# Patient Record
Sex: Female | Born: 1990 | Hispanic: Yes | Marital: Single | Smoking: Never smoker
Health system: Southern US, Community
[De-identification: ages and names within clinical notes are randomized; demographics above are authoritative.]

## PROBLEM LIST (undated history)

## (undated) HISTORY — PX: DENTAL SURGERY: SHX609

---

## 2009-03-22 ENCOUNTER — Emergency Department: Payer: Self-pay | Admitting: Emergency Medicine

## 2009-05-15 ENCOUNTER — Encounter: Payer: Self-pay | Admitting: Obstetrics and Gynecology

## 2009-06-19 ENCOUNTER — Encounter: Payer: Self-pay | Admitting: Obstetrics & Gynecology

## 2009-11-19 ENCOUNTER — Inpatient Hospital Stay: Payer: Self-pay | Admitting: Obstetrics and Gynecology

## 2014-07-03 LAB — OB RESULTS CONSOLE GC/CHLAMYDIA: GC PROBE AMP, GENITAL: NEGATIVE

## 2015-09-14 LAB — OB RESULTS CONSOLE RUBELLA ANTIBODY, IGM: RUBELLA: IMMUNE

## 2015-09-14 LAB — OB RESULTS CONSOLE VARICELLA ZOSTER ANTIBODY, IGG: VARICELLA IGG: IMMUNE

## 2015-10-13 LAB — OB RESULTS CONSOLE ABO/RH: RH TYPE: POSITIVE

## 2015-12-15 LAB — OB RESULTS CONSOLE HIV ANTIBODY (ROUTINE TESTING): HIV: NONREACTIVE

## 2015-12-31 NOTE — L&D Delivery Note (Signed)
VAGINAL DELIVERY NOTE:  Date of Delivery: 03/17/2016 [facility]  Gestational Age/EDD: 224w1d 03/30/2016, by Last Menstrual Period Antepartum complications: none Attending Physician: Annamarie MajorPaul Colm Lyford, MD, FACOG Delivery Type: spontaneous vaginal delivery  Anesthesia: epidural Laceration: 1st degree Episiotomy: none Placenta: spontaneous Intrapartum complications: None Estimated Blood Loss: 300 mL GBS: Neg Procedure Details: Prolonged active phase, helped with Pitocin and Epidural.  Brief second stage.  First degree with right lateral extension albeit small.  Baby: Liveborn female, Apgars 9/9, weight 7 #, 11 oz

## 2016-02-26 LAB — OB RESULTS CONSOLE GC/CHLAMYDIA
CHLAMYDIA, DNA PROBE: NEGATIVE
GC PROBE AMP, GENITAL: NEGATIVE

## 2016-02-26 LAB — OB RESULTS CONSOLE GBS: GBS: NEGATIVE

## 2016-03-17 ENCOUNTER — Encounter: Payer: Self-pay | Admitting: *Deleted

## 2016-03-17 ENCOUNTER — Inpatient Hospital Stay
Admission: EM | Admit: 2016-03-17 | Discharge: 2016-03-19 | DRG: 775 | Disposition: A | Discharge status: Home / Self Care | Payer: Medicaid Other | Attending: Obstetrics & Gynecology | Admitting: Obstetrics & Gynecology

## 2016-03-17 ENCOUNTER — Inpatient Hospital Stay: Payer: Medicaid Other | Admitting: Anesthesiology

## 2016-03-17 DIAGNOSIS — Z9889 Other specified postprocedural states: Secondary | ICD-10-CM | POA: Diagnosis not present

## 2016-03-17 DIAGNOSIS — O4690 Antepartum hemorrhage, unspecified, unspecified trimester: Secondary | ICD-10-CM | POA: Diagnosis present

## 2016-03-17 DIAGNOSIS — Z3A38 38 weeks gestation of pregnancy: Secondary | ICD-10-CM

## 2016-03-17 DIAGNOSIS — Z79899 Other long term (current) drug therapy: Secondary | ICD-10-CM

## 2016-03-17 LAB — RAPID HIV SCREEN (HIV 1/2 AB+AG)
HIV 1/2 Antibodies: NONREACTIVE
HIV-1 P24 Antigen - HIV24: NONREACTIVE

## 2016-03-17 LAB — TYPE AND SCREEN
ABO/RH(D): O POS
ANTIBODY SCREEN: NEGATIVE

## 2016-03-17 LAB — CHLAMYDIA/NGC RT PCR (ARMC ONLY)
CHLAMYDIA TR: NOT DETECTED
N gonorrhoeae: NOT DETECTED

## 2016-03-17 LAB — CBC
HEMATOCRIT: 31.6 % — AB (ref 35.0–47.0)
HEMOGLOBIN: 10.8 g/dL — AB (ref 12.0–16.0)
MCH: 27.6 pg (ref 26.0–34.0)
MCHC: 34.2 g/dL (ref 32.0–36.0)
MCV: 80.8 fL (ref 80.0–100.0)
Platelets: 210 10*3/uL (ref 150–440)
RBC: 3.92 MIL/uL (ref 3.80–5.20)
RDW: 16 % — ABNORMAL HIGH (ref 11.5–14.5)
WBC: 7.4 10*3/uL (ref 3.6–11.0)

## 2016-03-17 LAB — ABO/RH: ABO/RH(D): O POS

## 2016-03-17 MED ORDER — FENTANYL 2.5 MCG/ML W/ROPIVACAINE 0.2% IN NS 100 ML EPIDURAL INFUSION (ARMC-ANES)
EPIDURAL | Status: AC
  Filled 2016-03-17: qty 100

## 2016-03-17 MED ORDER — OXYTOCIN 40 UNITS IN LACTATED RINGERS INFUSION - SIMPLE MED
1.0000 m[IU]/min | INTRAVENOUS | Status: DC
  Administered 2016-03-17: 1 m[IU]/min via INTRAVENOUS

## 2016-03-17 MED ORDER — METHYLERGONOVINE MALEATE 0.2 MG/ML IJ SOLN
0.2000 mg | Freq: Once | INTRAMUSCULAR | Status: AC
  Administered 2016-03-17: 0.2 mg via INTRAMUSCULAR

## 2016-03-17 MED ORDER — BUPIVACAINE HCL (PF) 0.25 % IJ SOLN
INTRAMUSCULAR | Status: DC | PRN
  Administered 2016-03-17: 9 mL via EPIDURAL

## 2016-03-17 MED ORDER — MISOPROSTOL 200 MCG PO TABS
ORAL_TABLET | ORAL | Status: AC
  Filled 2016-03-17: qty 4

## 2016-03-17 MED ORDER — LIDOCAINE-EPINEPHRINE (PF) 1.5 %-1:200000 IJ SOLN
INTRAMUSCULAR | Status: DC | PRN
  Administered 2016-03-17: 3 mL via PERINEURAL

## 2016-03-17 MED ORDER — ACETAMINOPHEN 325 MG PO TABS
650.0000 mg | ORAL_TABLET | ORAL | Status: DC | PRN
Start: 2016-03-17 — End: 2016-03-18

## 2016-03-17 MED ORDER — LACTATED RINGERS IV SOLN
INTRAVENOUS | Status: DC
Start: 2016-03-17 — End: 2016-03-18
  Administered 2016-03-17: 17:00:00 via INTRAVENOUS

## 2016-03-17 MED ORDER — EPHEDRINE 5 MG/ML INJ
10.0000 mg | INTRAVENOUS | Status: DC | PRN
  Filled 2016-03-17: qty 2

## 2016-03-17 MED ORDER — OXYTOCIN 10 UNIT/ML IJ SOLN
INTRAMUSCULAR | Status: AC
  Filled 2016-03-17: qty 2

## 2016-03-17 MED ORDER — TERBUTALINE SULFATE 1 MG/ML IJ SOLN: 0.2500 mg | Freq: Once | INTRAMUSCULAR | Status: DC | PRN

## 2016-03-17 MED ORDER — FENTANYL 2.5 MCG/ML W/ROPIVACAINE 0.2% IN NS 100 ML EPIDURAL INFUSION (ARMC-ANES): 9.0000 mL/h | EPIDURAL | Status: DC

## 2016-03-17 MED ORDER — OXYTOCIN BOLUS FROM INFUSION
500.0000 mL | INTRAVENOUS | Status: DC
  Administered 2016-03-17: 500 mL via INTRAVENOUS

## 2016-03-17 MED ORDER — LIDOCAINE HCL (PF) 1 % IJ SOLN
INTRAMUSCULAR | Status: AC
  Administered 2016-03-17: 5 mL
  Filled 2016-03-17: qty 30

## 2016-03-17 MED ORDER — OXYTOCIN 40 UNITS IN LACTATED RINGERS INFUSION - SIMPLE MED
2.5000 [IU]/h | INTRAVENOUS | Status: DC
  Filled 2016-03-17: qty 1000

## 2016-03-17 MED ORDER — ONDANSETRON HCL 4 MG/2ML IJ SOLN: 4.0000 mg | Freq: Four times a day (QID) | INTRAMUSCULAR | Status: DC | PRN

## 2016-03-17 MED ORDER — LACTATED RINGERS IV SOLN: 500.0000 mL | INTRAVENOUS | Status: DC | PRN

## 2016-03-17 MED ORDER — ACETAMINOPHEN 325 MG PO TABS
650.0000 mg | ORAL_TABLET | ORAL | Status: DC | PRN
Start: 2016-03-17 — End: 2016-03-18
  Administered 2016-03-17: 650 mg via ORAL
  Filled 2016-03-17: qty 2

## 2016-03-17 MED ORDER — PHENYLEPHRINE 40 MCG/ML (10ML) SYRINGE FOR IV PUSH (FOR BLOOD PRESSURE SUPPORT)
80.0000 ug | PREFILLED_SYRINGE | INTRAVENOUS | Status: DC | PRN
  Filled 2016-03-17: qty 2

## 2016-03-17 MED ORDER — AMMONIA AROMATIC IN INHA
RESPIRATORY_TRACT | Status: AC
Start: 2016-03-17 — End: 2016-03-18
  Filled 2016-03-17: qty 10

## 2016-03-17 MED ORDER — DIPHENHYDRAMINE HCL 50 MG/ML IJ SOLN: 12.5000 mg | INTRAMUSCULAR | Status: DC | PRN

## 2016-03-17 MED ORDER — BUTORPHANOL TARTRATE 1 MG/ML IJ SOLN: 1.0000 mg | INTRAMUSCULAR | Status: DC | PRN

## 2016-03-17 MED ORDER — METHYLERGONOVINE MALEATE 0.2 MG/ML IJ SOLN
INTRAMUSCULAR | Status: AC
Start: 2016-03-17 — End: 2016-03-18
  Filled 2016-03-17: qty 1

## 2016-03-17 MED ORDER — ONDANSETRON HCL 4 MG/2ML IJ SOLN
4.0000 mg | Freq: Four times a day (QID) | INTRAMUSCULAR | Status: DC | PRN
Start: 2016-03-17 — End: 2016-03-18

## 2016-03-17 MED ORDER — LACTATED RINGERS IV SOLN: 500.0000 mL | Freq: Once | INTRAVENOUS | Status: DC

## 2016-03-17 NOTE — H&P (Signed)
Obstetrics Admission History & Physical   Vaginal Bleeding   HPI:  25 y.o. G2P1001 @ 5547w1d (03/30/2016, by Last Menstrual Period). Admitted on 03/17/2016:   Patient Active Problem List   Diagnosis Date Noted  . Vaginal bleeding in pregnancy 03/17/2016  . Normal labor 03/17/2016     Presents for bloody show, also having mild cts.  Prenatal care at: at another place [facility]  No reported problems.  No PNL available, GBS is neg.  PMHx: History reviewed. No pertinent past medical history. PSHx:  Past Surgical History  Procedure Laterality Date  . Dental surgery Left    Medications:  Prescriptions prior to admission  Medication Sig Dispense Refill Last Dose  . Prenatal Vit-Fe Fumarate-FA (PRENATAL MULTIVITAMIN) TABS tablet Take 1 tablet by mouth daily at 12 noon.   Past Week at Unknown time   Allergies: has No Known Allergies. OBHx:  OB History  Gravida Para Term Preterm AB SAB TAB Ectopic Multiple Living  2 1 1  0 0 0 0 0 0 1    # Outcome Date GA Lbr Len/2nd Weight Sex Delivery Anes PTL Lv  2 Current           1 Term 11/20/15 5948w0d  4.224 kg (9 lb 5 oz) M Vag-Vacuum None N Y    Obstetric Comments  Pt had "large epis. Due to baby's size"   NWG:NFAOZHYQ/MVHQIONGEXBMFHx:Negative/unremarkable except as detailed in HPI. Soc Hx: Pregnancy welcomed  Objective:   Filed Vitals:   03/17/16 0631  BP: 110/65  Pulse: 68  Temp: 98.3 F (36.8 C)  Resp: 20   General: Well nourished, well developed female in no acute distress.  Skin: Warm and dry.  Cardiovascular:Regular rate and rhythm. Respiratory: Clear to auscultation bilateral. Normal respiratory effort Abdomen: NT gravid Neuro/Psych: Normal mood and affect.   Pelvic exam: is not limited by body habitus EGBUS: within normal limits Vagina: within normal limits and with normal mucosa blood in the vault Cervix: 3 cm Uterus: Spontaneous uterine activity  Adnexa: not evaluated  EFM:FHR: 130 bpm, variability: moderate,   accelerations:  Present,  decelerations:  Absent Toco: Frequency: Every 5-8 minutes   Perinatal info:  Blood type: unknown Rubella- Unknown Varicella -Unknown TDaP tetanus status unknown to the patient RPR NR / HIV Neg/ HBsAg Neg   Assessment & Plan:   25 y.o. G2P1001 @ 6947w1d, Admitted on 03/17/2016:  Labor.  Prior NSVD 6 years ago.       Admit for labor, Fetal Wellbeing Reassuring and AROM when Appropriate

## 2016-03-17 NOTE — Progress Notes (Signed)
Comfortable w epidural Cervix still 5 cm Ctxs q 3-4 min FHTs 140s, mod var, accels, rare var brief decel Will start Pitocin now Counseled on CS for arrest of dilation if no progress with Pitocin    Prior NSVD 6 years ago so still anticipate vag delivery FWR

## 2016-03-17 NOTE — Progress Notes (Signed)
  Labor Progress Note   25 y.o. G2P1001 @ 4118w1d , admitted for  Pregnancy, Labor Management.   Subjective:  Pain mild w ctxs.  Objective:  BP 107/68 mmHg  Pulse 73  Temp(Src) 98.3 F (36.8 C) (Oral)  Resp 16  Wt 58.968 kg (130 lb)  LMP 06/24/2015 (LMP Unknown)  Breastfeeding? Unknown Abd: moderate Extr: trace to 1+ bilateral pedal edema SVE: CERVIX: 4 cm dilated, 80 effaced, -2 station  EFM: FHR: 140 bpm, variability: moderate,  accelerations:  Present,  decelerations:  Absent Toco: Frequency: Every 5 minutes Labs: I have reviewed the patient's lab results.   Assessment & Plan:  G2P1001 @ 2018w1d, admitted for  Pregnancy and Labor/Delivery Management  1. Pain management: none. 2. FWB: FHT category 1.  3. ID: GBS negative 4. Labor management: Cont current plan of mgt- exp mgt. All discussed with patient, see orders

## 2016-03-17 NOTE — Discharge Summary (Signed)
  Obstetrical Discharge Summary  Date of Admission: 03/17/2016 Date of Discharge: 03/19/2016 Discharge Diagnosis: Term Pregnancy-delivered Primary OB:  [facility] --- [facility]   Gestational Age at Delivery: 325w1d  Antepartum complications: none Date of Delivery: 03/17/16  Delivered By: Tiburcio PeaHarris Delivery Type: spontaneous vaginal delivery Intrapartum complications/course: None Anesthesia: epidural Placenta: spontaneous Laceration: 1st degree Episiotomy: none Live born female  Birth Weight:  7-10/3479 APGAR: 9, 9   Post partum course: Since the delivery, patient has tolerate activity, diet, and daily functions without difficulty or complication.  Min lochia.  No breast concerns at this time.  No signs of depression currently.   Postpartum Exam:General appearance: alert, cooperative and appears stated age  Disposition: home with infant Rh Immune globulin given: not applicable Rubella vaccine given: not applicable Varicella vaccine given: not applicable Tdap vaccine given in AP or PP setting: given during prenatal care Flu vaccine given in AP or PP setting: given during prenatal care Contraception: Nexplanon  Prenatal Labs: O POS//Rubella Immune//RPR negative//HIV negative/HepB Surface Ag negative//plans to breastfeed  Plan:  Brandi Wallace was discharged to home in good condition. [facility]  Discharge Medications:   Medication List    TAKE these medications        prenatal multivitamin Tabs tablet  Take 1 tablet by mouth daily at 12 noon.        Follow-up arrangements:  Make appointment for 6 week follow up visit [facility]   Brandi Wallace, CNM   This patient and plan were discussed with Dr Bonney AidStaebler 03/19/2016

## 2016-03-17 NOTE — Anesthesia Procedure Notes (Signed)
Epidural Patient location during procedure: OB Start time: 03/17/2016 7:11 PM End time: 03/17/2016 7:19 PM  Staffing Anesthesiologist: Yves DillARROLL, Cici Rodriges Performed by: anesthesiologist   Preanesthetic Checklist Completed: patient identified, site marked, surgical consent, pre-op evaluation, timeout performed, IV checked, risks and benefits discussed and monitors and equipment checked  Epidural Patient position: sitting Prep: Betadine and site prepped and draped Patient monitoring: heart rate, cardiac monitor, continuous pulse ox and blood pressure Approach: midline Location: L3-L4 Injection technique: LOR air  Needle:  Needle type: Tuohy  Needle gauge: 18 G Needle length: 9 cm Test dose: negative and 1.5% lidocaine with Epi 1:200 K  Assessment Sensory level: T8  Additional Notes Time out called.  Patient placed in sitting position and prepped and draped in sterile fashion.  A skin wheal was made with 1% Lidocaine plain at the L3-L4 interspace.  An 8018 G Tuohy needle was advanced to the epidural space by a loss of resistance technique.  The epidural cateter was threaded 3 cm with negative TD, no heme and no paresthesia.   The patient tolerated the procedure well and the catheter was fixed in sterile fashion and tegaderm and tape applied.Reason for block:procedure for pain

## 2016-03-17 NOTE — Progress Notes (Signed)
  Labor Progress Note   25 y.o. G2P1001 @ 6934w1d , admitted for Pregnancy, Labor Management.   Subjective:  Pain mild w ctxs.  No desire for analgesia.  Objective:  BP 109/69 mmHg  Pulse 71  Temp(Src) 98.3 F (36.8 C) (Oral)  Resp 16  Wt 58.968 kg (130 lb)  Abd: moderate pain Extr: trace to 1+ bilateral pedal edema SVE: CERVIX: 5cm dilated, 90 effaced, -1station   EFM: FHR: 140 bpm, variability: moderate,  accelerations:  Present,  decelerations:  Absent Toco: Frequency: Every 3-4 minutes  Assessment & Plan:  G2P1001 @ 9434w1d, admitted for Pregnancy and Labor/Delivery Management  1. Pain management: none. 2. FWB: FHT category 1.  3. ID: GBS negative 4. Labor management: AROM clear. Plan Pitocin to help augment labor as needed (not started yet). . All discussed with patient, see orders

## 2016-03-17 NOTE — Progress Notes (Signed)
  Labor Progress Note   25 y.o. G2P1001 @ 178w1d , admitted for Pregnancy, Labor Management.   Subjective:  Pain mild w ctxs.  No desire for analgesia.  Objective:  BP 109/69 mmHg  Pulse 71  Temp(Src) 98.3 F (36.8 C) (Oral)  Resp 16  Wt 58.968 kg (130 lb)  LMP 06/24/2015 (LMP Unknown)  Breastfeeding? Unknown Abd: moderate Extr: trace to 1+ bilateral pedal edema SVE: CERVIX: 4 cm dilated, 80 effaced, -2 station - - - UNCHANGED  EFM: FHR: 140 bpm, variability: moderate,  accelerations:  Present,  decelerations:  Absent Toco: Frequency: Every 5 minutes  Assessment & Plan:  G2P1001 @ 1178w1d, admitted for Pregnancy and Labor/Delivery Management  1. Pain management: none. 2. FWB: FHT category 1.  3. ID: GBS negative 4. Labor management: Pitocin to help augment labor . All discussed with patient, see orders

## 2016-03-17 NOTE — Anesthesia Preprocedure Evaluation (Signed)
Anesthesia Evaluation  Patient identified by MRN, date of birth, ID band Patient awake    Reviewed: Allergy & Precautions, NPO status , Patient's Chart, lab work & pertinent test results  Airway Mallampati: II  TM Distance: >3 FB Neck ROM: Full    Dental no notable dental hx.    Pulmonary neg pulmonary ROS,    Pulmonary exam normal breath sounds clear to auscultation       Cardiovascular negative cardio ROS Normal cardiovascular exam     Neuro/Psych negative neurological ROS  negative psych ROS   GI/Hepatic negative GI ROS, Neg liver ROS,   Endo/Other  negative endocrine ROS  Renal/GU negative Renal ROS  negative genitourinary   Musculoskeletal negative musculoskeletal ROS (+)   Abdominal Normal abdominal exam  (+)   Peds negative pediatric ROS (+)  Hematology negative hematology ROS (+)   Anesthesia Other Findings   Reproductive/Obstetrics (+) Pregnancy                             Anesthesia Physical Anesthesia Plan  ASA: II  Anesthesia Plan: Epidural   Post-op Pain Management:    Induction:   Airway Management Planned: Natural Airway  Additional Equipment:   Intra-op Plan:   Post-operative Plan:   Informed Consent: I have reviewed the patients History and Physical, chart, labs and discussed the procedure including the risks, benefits and alternatives for the proposed anesthesia with the patient or authorized representative who has indicated his/her understanding and acceptance.   Dental advisory given  Plan Discussed with: CRNA and Surgeon  Anesthesia Plan Comments:         Anesthesia Quick Evaluation

## 2016-03-17 NOTE — OB Triage Note (Addendum)
Pt noting some bright red vaginal bleeding when arising to void this am, approx 0515. Reports nil pain before, during or after episode. Reports last v/e at office- last Monday, does not remember result of exam. Denies intercourse recently. States she did not need to wear a pad into hospital. Panties dry. Pt states she passed as small amount of mucousy discharge when voiding presently.

## 2016-03-17 NOTE — Progress Notes (Signed)
2055-Pt noting that it is somewhat hard to catch her breath. HR bsreline 60's-70's, O2 sat 100%. Epidural levels checked- pt numb bilaterally to 2" below breasts. Hob elevated. No c/o's of numbness with hands, arms. Feet moving- comfortable with u/c's- notes rectal pressure at peak of u/c's at times. Alert and orientated. Pt reassurred appropriately Dr Hal Hopecarrol paged, given report. Dr requesting continous epidural infusion be decreased to 7 and hob remain elevated. Done

## 2016-03-18 LAB — CBC
HEMATOCRIT: 31.1 % — AB (ref 35.0–47.0)
HEMOGLOBIN: 10.4 g/dL — AB (ref 12.0–16.0)
MCH: 27 pg (ref 26.0–34.0)
MCHC: 33.6 g/dL (ref 32.0–36.0)
MCV: 80.3 fL (ref 80.0–100.0)
Platelets: 181 10*3/uL (ref 150–440)
RBC: 3.87 MIL/uL (ref 3.80–5.20)
RDW: 15.7 % — AB (ref 11.5–14.5)
WBC: 11.4 10*3/uL — AB (ref 3.6–11.0)

## 2016-03-18 LAB — HEPATITIS B SURFACE ANTIGEN: Hepatitis B Surface Ag: NEGATIVE

## 2016-03-18 MED ORDER — ACETAMINOPHEN 325 MG PO TABS: 650.0000 mg | ORAL_TABLET | ORAL | Status: DC | PRN

## 2016-03-18 MED ORDER — SIMETHICONE 80 MG PO CHEW: 80.0000 mg | CHEWABLE_TABLET | ORAL | Status: DC | PRN

## 2016-03-18 MED ORDER — LANOLIN HYDROUS EX OINT: TOPICAL_OINTMENT | CUTANEOUS | Status: DC | PRN

## 2016-03-18 MED ORDER — WITCH HAZEL-GLYCERIN EX PADS: 1.0000 "application " | MEDICATED_PAD | CUTANEOUS | Status: DC | PRN

## 2016-03-18 MED ORDER — SENNOSIDES-DOCUSATE SODIUM 8.6-50 MG PO TABS
2.0000 | ORAL_TABLET | ORAL | Status: DC
  Administered 2016-03-19: 2 via ORAL
  Filled 2016-03-18: qty 2

## 2016-03-18 MED ORDER — IBUPROFEN 600 MG PO TABS
ORAL_TABLET | ORAL | Status: AC
  Administered 2016-03-18: 600 mg via ORAL
  Filled 2016-03-18: qty 1

## 2016-03-18 MED ORDER — SODIUM CHLORIDE 0.9% FLUSH: 3.0000 mL | INTRAVENOUS | Status: DC | PRN

## 2016-03-18 MED ORDER — ZOLPIDEM TARTRATE 5 MG PO TABS: 5.0000 mg | ORAL_TABLET | Freq: Every evening | ORAL | Status: DC | PRN

## 2016-03-18 MED ORDER — IBUPROFEN 600 MG PO TABS
600.0000 mg | ORAL_TABLET | Freq: Four times a day (QID) | ORAL | Status: DC
  Administered 2016-03-18: 600 mg via ORAL
  Filled 2016-03-18: qty 1

## 2016-03-18 MED ORDER — BENZOCAINE-MENTHOL 20-0.5 % EX AERO
1.0000 "application " | INHALATION_SPRAY | CUTANEOUS | Status: DC | PRN
  Filled 2016-03-18: qty 56

## 2016-03-18 MED ORDER — IBUPROFEN 600 MG PO TABS
600.0000 mg | ORAL_TABLET | Freq: Four times a day (QID) | ORAL | Status: DC
  Administered 2016-03-18: 600 mg via ORAL

## 2016-03-18 MED ORDER — DIPHENHYDRAMINE HCL 25 MG PO CAPS: 25.0000 mg | ORAL_CAPSULE | Freq: Four times a day (QID) | ORAL | Status: DC | PRN

## 2016-03-18 MED ORDER — SODIUM CHLORIDE 0.9 % IV SOLN: 250.0000 mL | INTRAVENOUS | Status: DC | PRN

## 2016-03-18 MED ORDER — SODIUM CHLORIDE 0.9% FLUSH
3.0000 mL | Freq: Two times a day (BID) | INTRAVENOUS | Status: DC
Start: 2016-03-18 — End: 2016-03-18

## 2016-03-18 MED ORDER — ONDANSETRON HCL 4 MG/2ML IJ SOLN: 4.0000 mg | INTRAMUSCULAR | Status: DC | PRN

## 2016-03-18 MED ORDER — DIBUCAINE 1 % RE OINT: 1.0000 "application " | TOPICAL_OINTMENT | RECTAL | Status: DC | PRN

## 2016-03-18 MED ORDER — ONDANSETRON HCL 4 MG PO TABS: 4.0000 mg | ORAL_TABLET | ORAL | Status: DC | PRN

## 2016-03-18 MED ORDER — IBUPROFEN 600 MG PO TABS
600.0000 mg | ORAL_TABLET | Freq: Four times a day (QID) | ORAL | Status: DC
  Administered 2016-03-18 – 2016-03-19 (×4): 600 mg via ORAL
  Filled 2016-03-18 (×4): qty 1

## 2016-03-18 NOTE — Lactation Note (Signed)
This note was copied from a baby's chart. Lactation Consultation Note  Patient Name: Boy Ardis RowanKarina Gonzalez Diaz ZOXWR'UToday's Date: 03/18/2016 Reason for consult: Follow-up assessment   Maternal Data Does the patient have breastfeeding experience prior to this delivery?: Yes Mom encouraged to  breastfeed first before offering formula, call for help if baby not latching well before offering formula. breastfed last child for 2 yrs.   Feeding Feeding Type: Breast Milk Nipple Type: Slow - flow Length of feed: 40 min (20 min. each breast)  LATCH Score/Interventions Latch: Grasps breast easily, tongue down, lips flanged, rhythmical sucking.  Audible Swallowing: A few with stimulation Intervention(s): Skin to skin  Type of Nipple: Everted at rest and after stimulation  Comfort (Breast/Nipple): Soft / non-tender     Hold (Positioning): No assistance needed to correctly position infant at breast.  LATCH Score: 9  Lactation Tools Discussed/Used Tools: Bottle WIC Program: Yes   Consult Status Consult Status: PRN Date: 03/18/16    Dyann KiefMarsha D Chrisy Hillebrand 03/18/2016, 3:01 PM

## 2016-03-18 NOTE — Discharge Instructions (Signed)
Vaginal Delivery, Care After Refer to this sheet in the next few weeks. These discharge instructions provide you with information on caring for yourself after delivery. Your health care provider may also give you specific instructions. Your treatment has been planned according to the most current medical practices available, but problems sometimes occur. Call your health care provider if you have any problems or questions after you go home. HOME CARE INSTRUCTIONS  Take over-the-counter or prescription medicines only as directed by your health care provider or pharmacist.  Do not drink alcohol, especially if you are breastfeeding or taking medicine to relieve pain.  Do not chew or smoke tobacco.  Do not use illegal drugs.  Continue to use good perineal care. Good perineal care includes:  Wiping your perineum from front to back.  Keeping your perineum clean.  Do not use tampons or douche for the next 6 weeks.   Showers only, no tub baths for the next 6 weeks. Use a sitz bath as needed.   Wear a well-fitting bra that provides breast support.  Eat healthy foods.  Drink enough fluids to keep your urine clear or pale yellow.  Eat high-fiber foods such as whole grain cereals and breads, brown rice, beans, and fresh fruits and vegetables every day. These foods may help prevent or relieve constipation.  Follow your health care provider's recommendations regarding resumption of activities such as climbing stairs, driving, lifting, exercising, or traveling.  No sexual intercourse for at least the next 6 weeks, and then not until you feel ready.   Try to have someone help you with your household activities and your newborn for at least a few days after you leave the hospital.  Rest as much as possible. Try to rest or take a nap when your newborn is sleeping.  Increase your activities gradually.  Keep all of your scheduled postpartum appointments. It is very important to keep your  scheduled follow-up appointments. At these appointments, your health care provider will be checking to make sure that you are healing physically and emotionally. SEEK MEDICAL CARE IF:   You are passing large clots from your vagina. Save any clots to show your health care provider.  You have a foul smelling discharge from your vagina.  You have trouble urinating.  You are urinating frequently.  You have pain when you urinate.  You have a change in your bowel movements.  You have increasing redness, pain, or swelling near your vaginal incision (episiotomy) or vaginal tear.  You have pus draining from your episiotomy or vaginal tear.  Your episiotomy or vaginal tear is separating.  You have painful, hard, or reddened breasts.  You have a severe headache.  You have blurred vision or see spots.  You feel sad or depressed.  You have thoughts of hurting yourself or your newborn.  You have questions about your care, the care of your newborn, or medicines.  You are dizzy or light-headed.  You have a rash.  You have nausea or vomiting.  You were breastfeeding and have not had a menstrual period within 12 weeks after you stopped breastfeeding.  You are not breastfeeding and have not had a menstrual period by the 12th week after delivery.  You have a fever. SEEK IMMEDIATE MEDICAL CARE IF:   You have persistent pain.  You have chest pain.  You have shortness of breath.  You faint.  You have leg pain.  You have stomach pain.  Your vaginal bleeding saturates two or more sanitary  pads in 1 hour.   This information is not intended to replace advice given to you by your health care provider. Make sure you discuss any questions you have with your health care provider.   Document Released: 12/13/2000 Document Revised: 09/06/2015 Document Reviewed: 08/12/2012 Elsevier Interactive Patient Education [employer].

## 2016-03-18 NOTE — Anesthesia Postprocedure Evaluation (Signed)
Anesthesia Post Note  Patient: Brandi Wallace  Procedure(s) Performed: * No procedures listed *  Patient location during evaluation: Mother Baby Anesthesia Type: Epidural Level of consciousness: awake and alert and oriented Pain management: pain level controlled Vital Signs Assessment: post-procedure vital signs reviewed and stable Respiratory status: spontaneous breathing Cardiovascular status: stable Postop Assessment: no headache Anesthetic complications: no    Last Vitals:  Filed Vitals:   03/18/16 0245 03/18/16 0317  BP: 116/86 103/58  Pulse: 80 71  Temp:  36.9 C  Resp:  20    Last Pain:  Filed Vitals:   03/18/16 0441  PainSc: 0-No pain                 Abreanna Drawdy,  Alessandra BevelsJennifer M

## 2016-03-18 NOTE — Progress Notes (Signed)
Post Partum Day 1 Subjective: Sore perineum. Tolerating regular diet. Voiding without difficulty. cramping controlled by analgesics. Breast and Bt feeding  Objective: Blood pressure 99/58, pulse 68, temperature 98.6 F (37 C), temperature source Oral, resp. rate 20, height 5' (1.524 m), weight 58.968 kg (130 lb), last menstrual period 06/24/2015, SpO2 100 %, unknown if currently breastfeeding.  Physical Exam:  General: alert and happy, in NAD Lochia: appropriate Uterine Fundus: firm at U/ sl right of ML/ NT perineum: intact, no inflammation  DVT Evaluation: No evidence of DVT seen on physical exam.   Recent Labs  03/17/16 1015 03/18/16 0609  HGB 10.8* 10.4*  HCT 31.6* 31.1*  WBC 7.4 11.4*  PLT 210 181    Assessment/Plan: Stable PPD #1 Plan discharge for tomorrow Dermoplast and sitz baths Mild anemia-vitamins Breast and Bt TDAP and flu UTD O POS/ RI/ VI   LOS: 1 day   Dru Primeau 03/18/2016, 3:39 PM

## 2016-03-19 NOTE — Progress Notes (Signed)
Discharge instructions complete. Patient verbalizes understanding of teaching. Patient discharge home at 1145.

## 2016-03-20 LAB — VARICELLA-ZOSTER BY PCR: VARICELLA-ZOSTER, PCR: NEGATIVE

## 2016-03-20 LAB — RUBELLA SCREEN: Rubella: 4.39 index (ref >0.99)

## 2016-03-20 LAB — RPR: RPR: NONREACTIVE

## 2017-01-07 LAB — OB RESULTS CONSOLE GC/CHLAMYDIA
CHLAMYDIA, DNA PROBE: NEGATIVE
GC PROBE AMP, GENITAL: NEGATIVE

## 2017-01-07 LAB — OB RESULTS CONSOLE GBS: GBS: NEGATIVE

## 2017-07-01 LAB — OB RESULTS CONSOLE HIV ANTIBODY (ROUTINE TESTING): HIV: NONREACTIVE

## 2017-07-02 LAB — OB RESULTS CONSOLE VARICELLA ZOSTER ANTIBODY, IGG: Varicella: IMMUNE

## 2017-07-02 LAB — OB RESULTS CONSOLE RPR: RPR: NONREACTIVE

## 2017-07-02 LAB — OB RESULTS CONSOLE HEPATITIS B SURFACE ANTIGEN: Hepatitis B Surface Ag: NEGATIVE

## 2017-07-02 LAB — OB RESULTS CONSOLE RUBELLA ANTIBODY, IGM: RUBELLA: IMMUNE

## 2017-07-03 LAB — OB RESULTS CONSOLE GC/CHLAMYDIA: CHLAMYDIA, DNA PROBE: NEGATIVE

## 2017-07-29 ENCOUNTER — Other Ambulatory Visit: Payer: Self-pay | Admitting: Advanced Practice Midwife

## 2017-07-29 DIAGNOSIS — Z3482 Encounter for supervision of other normal pregnancy, second trimester: Secondary | ICD-10-CM

## 2017-08-01 ENCOUNTER — Ambulatory Visit
Admission: RE | Admit: 2017-08-01 | Discharge: 2017-08-01 | Disposition: A | Discharge status: Home / Self Care | Payer: Medicaid Other | Source: Ambulatory Visit | Attending: Advanced Practice Midwife | Admitting: Advanced Practice Midwife

## 2017-08-01 DIAGNOSIS — O4402 Placenta previa specified as without hemorrhage, second trimester: Secondary | ICD-10-CM | POA: Diagnosis not present

## 2017-08-01 DIAGNOSIS — Z3A14 14 weeks gestation of pregnancy: Secondary | ICD-10-CM | POA: Insufficient documentation

## 2017-08-01 DIAGNOSIS — Z3482 Encounter for supervision of other normal pregnancy, second trimester: Secondary | ICD-10-CM | POA: Diagnosis present

## 2017-08-20 IMAGING — US US OB COMP +14 WK
1 series · 3 of 3 positions shown · non-contrast
Comparison: none

CLINICAL DATA: Second trimester evaluation

EXAM:
OBSTETRICAL ULTRASOUND >14 WKS

[Series 1: us ob comp +14 wk · 3 acquisitions, 3 frames shown]
[im 1/3]
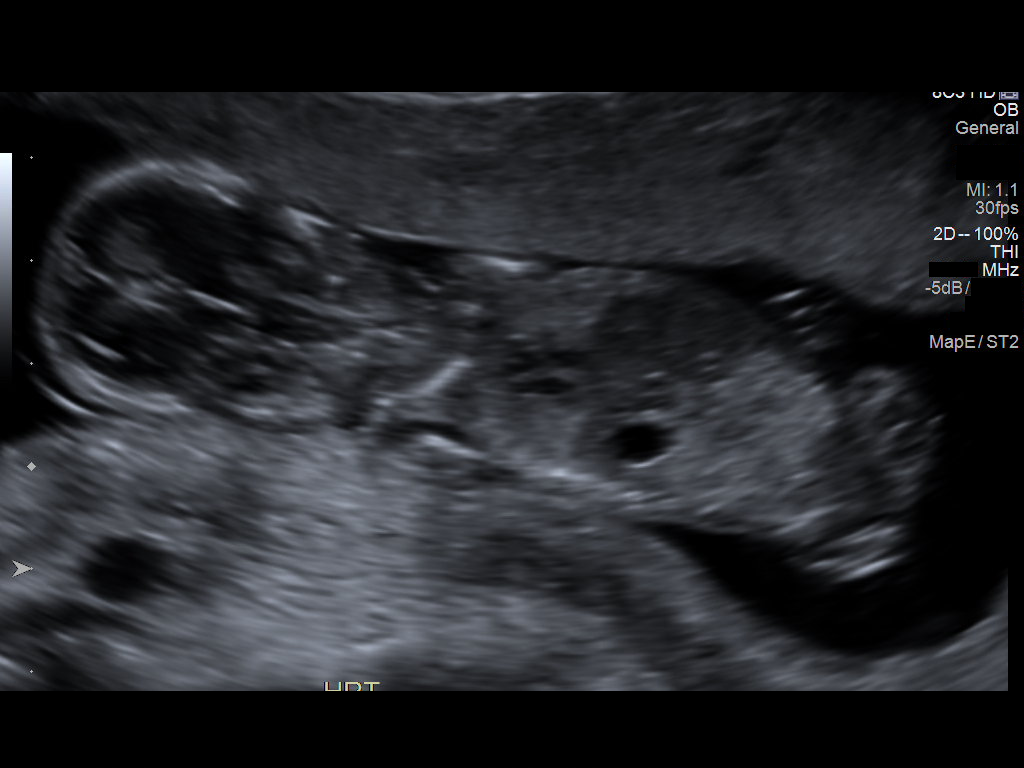
[im 2/3]
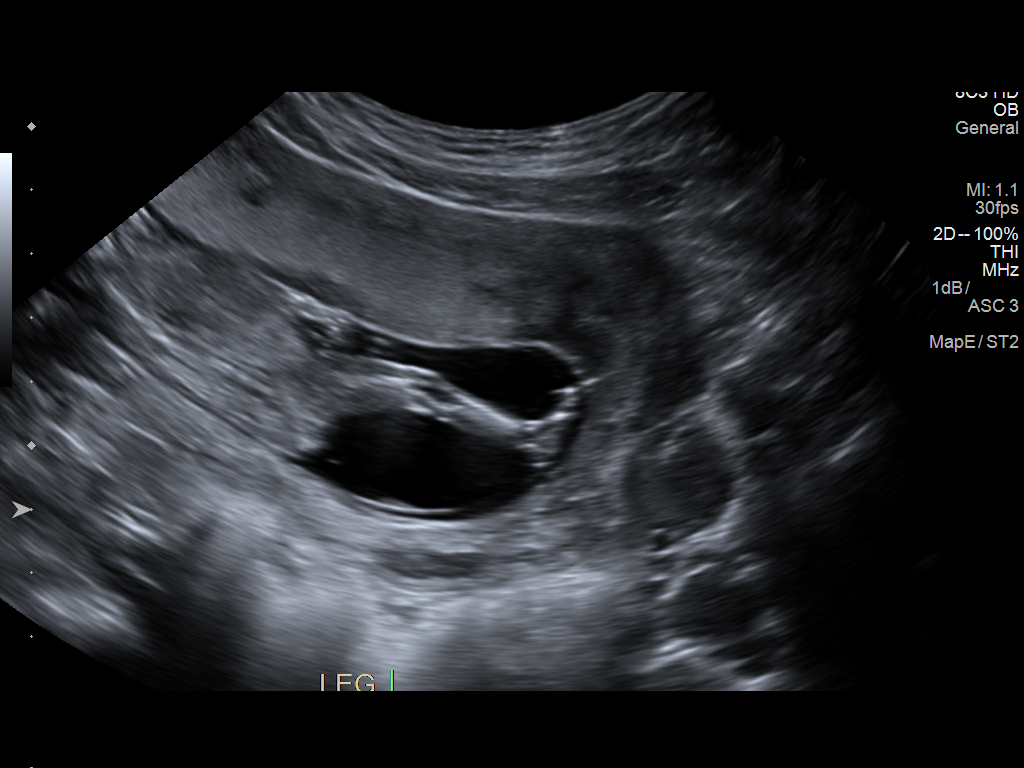
[im 3/3]
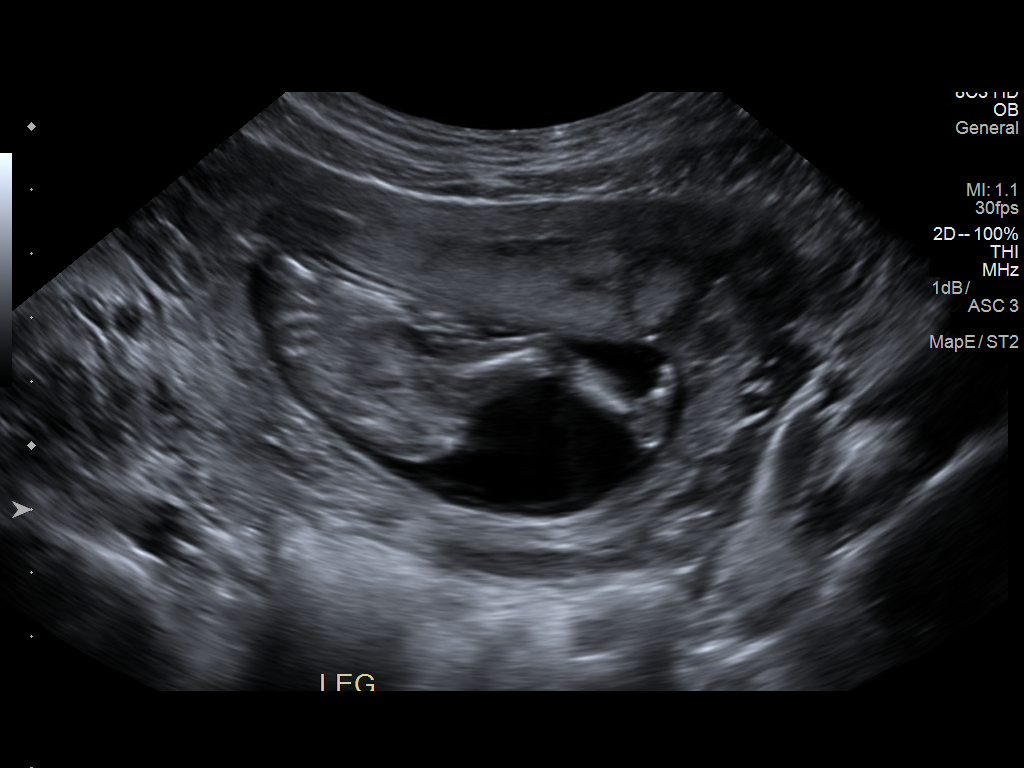

[3 of 3 positions shown; findings below may reference images not displayed]

FINDINGS: Number of Fetuses: 1

Heart Rate:  163 bpm

Movement: Present

Presentation: Variable

Previa: Complete placenta previa.

Placental Location: Anterior again with placenta previa

Amniotic Fluid (Subjective): Normal

Amniotic Fluid (Objective):

Vertical pocket 4cm

FETAL BIOMETRY

BPD:  2.4cm 14w 0d

HC:    9.4cm  14w   2d

AC:   7.9cm  14w   2d

FL:   1.3cm  14w   0d

Current Mean GA: 14w 1d              US EDC: 01/29/2017

Lateral Ventricles: Not visualized

Thalami/CSP: Not visualized

Posterior Fossa:  Not visual

Nuchal Region: Not visual

Upper Lip: Not visualized

Spine: Not visualized

4 Chamber Heart on Left: Not visualized

LVOT: Not visualized

RVOT: Not visualized

Stomach on Left: Visualized

3 Vessel Cord: Visualized

Cord Insertion site: Visualized

Kidneys: Not visualized

Bladder: Visualized

Extremities: Visualized

Technically difficult due to: Very limited exam due to 29
gestational age.

Maternal Findings:

Cervix:  4.7 cm and closed
IMPRESSION: 1.  Single viable intrauterine pregnancy at 14 weeks 1 day.

2.  Limited fetal evaluation due to early gestational age.

3. Complete placenta previa.  Follow-up exam suggested.

## 2017-09-02 ENCOUNTER — Other Ambulatory Visit (HOSPITAL_COMMUNITY): Payer: Self-pay | Admitting: Family Medicine

## 2017-09-02 DIAGNOSIS — Z3482 Encounter for supervision of other normal pregnancy, second trimester: Secondary | ICD-10-CM

## 2017-09-18 ENCOUNTER — Ambulatory Visit
Admission: RE | Admit: 2017-09-18 | Discharge: 2017-09-18 | Disposition: A | Discharge status: Home / Self Care | Payer: Self-pay | Source: Ambulatory Visit | Attending: Family Medicine | Admitting: Family Medicine

## 2017-09-18 DIAGNOSIS — Z3482 Encounter for supervision of other normal pregnancy, second trimester: Secondary | ICD-10-CM | POA: Insufficient documentation

## 2017-10-07 IMAGING — US US OB FOLLOW-UP
1 series · 14 of 28 positions shown · non-contrast
Comparison: none

CLINICAL DATA: Second trimester evaluation. Evaluate for placenta
previa.

EXAM:
OBSTETRICAL ULTRASOUND >14 WKS AND TRANSVAGINAL OB US

[Series 1: us ob follow-up · 0.25mm/px · 14 of 71 slices shown]
[im 3/71]
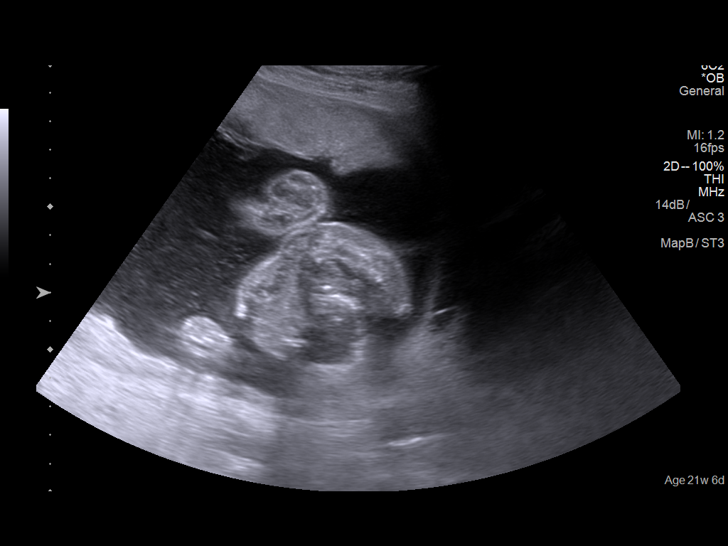
[im 8/71]
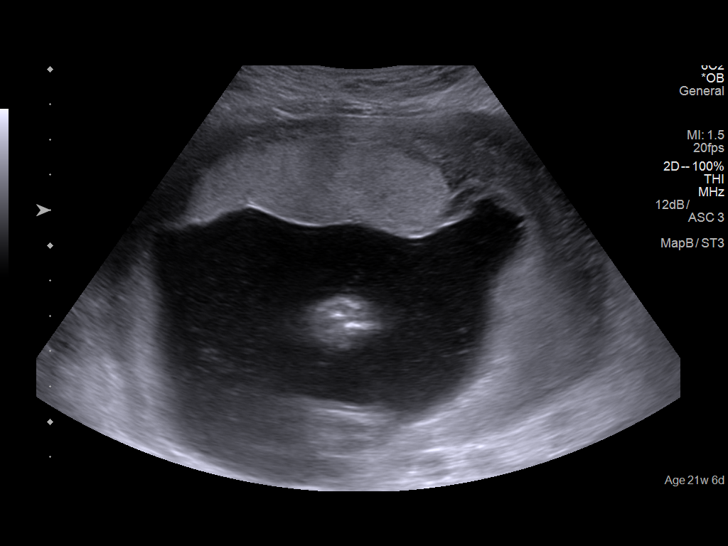
[im 13/71]
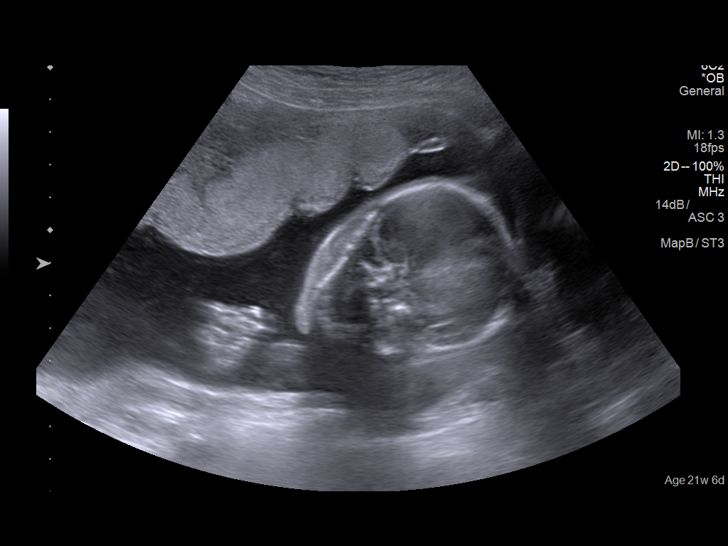
[im 19/71]
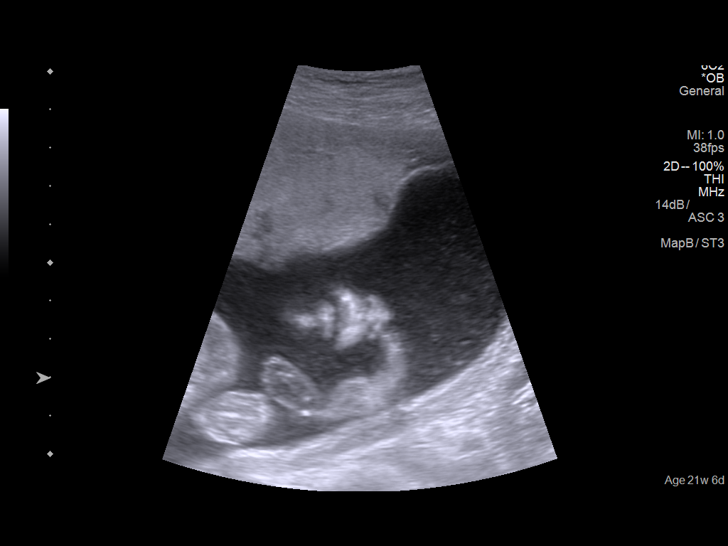
[im 24/71]
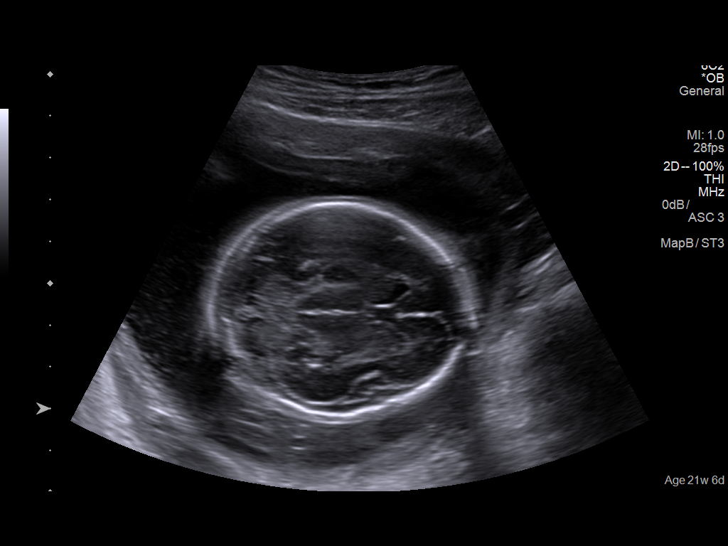
[im 29/71]
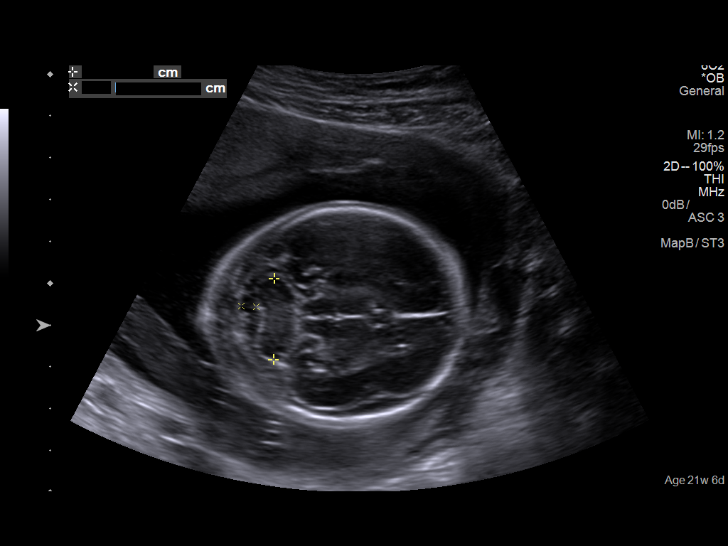
[im 34/71]
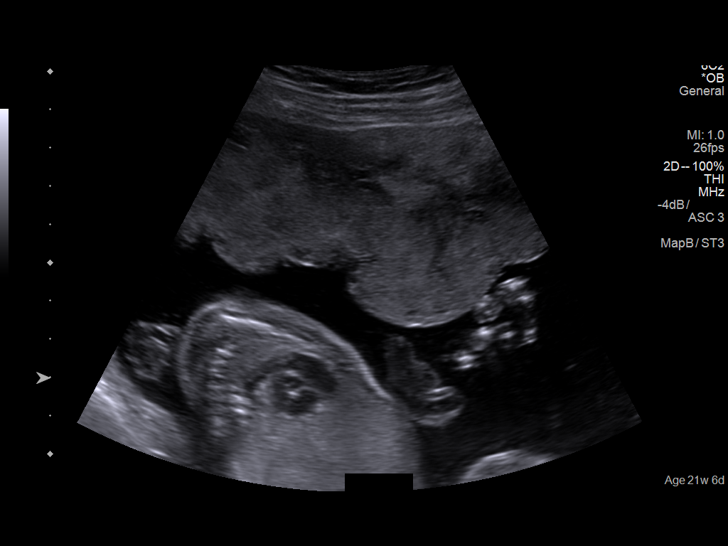
[im 39/71]
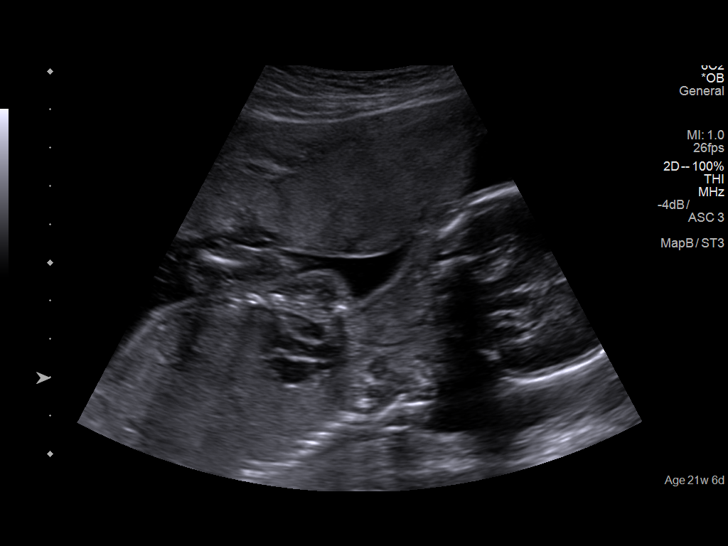
[im 45/71]
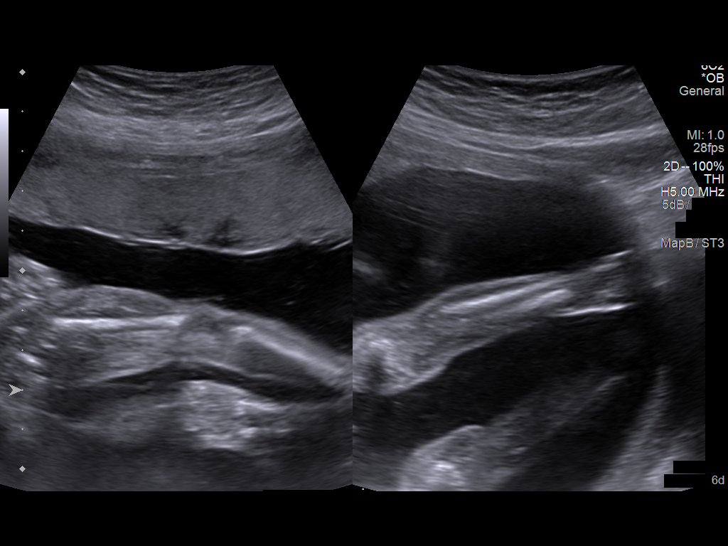
[im 50/71]
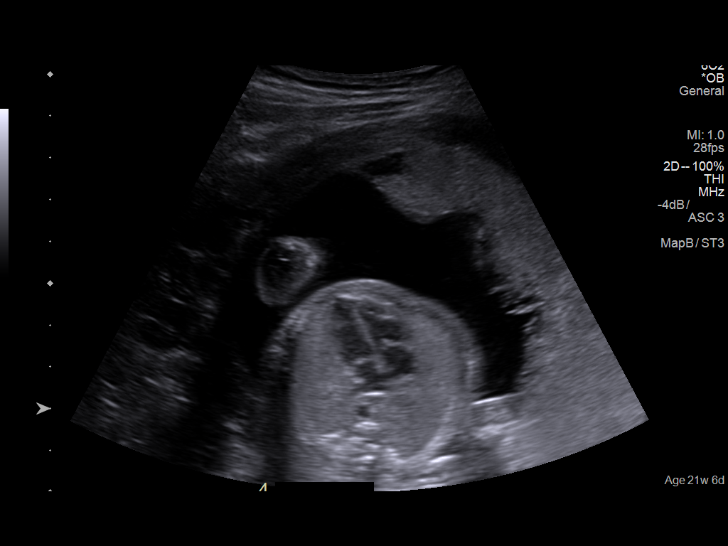
[im 55/71]
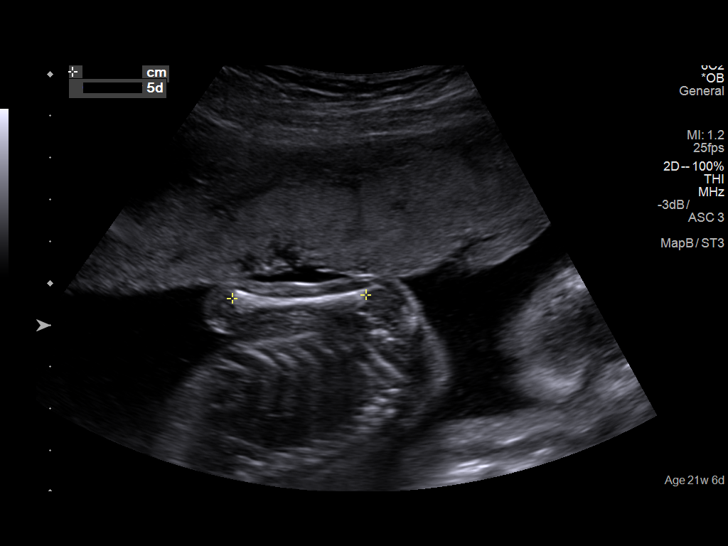
[im 60/71]
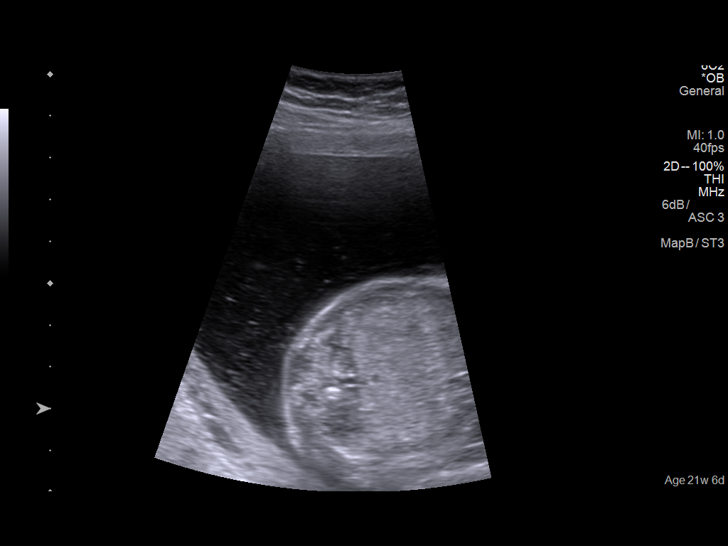
[im 65/71]
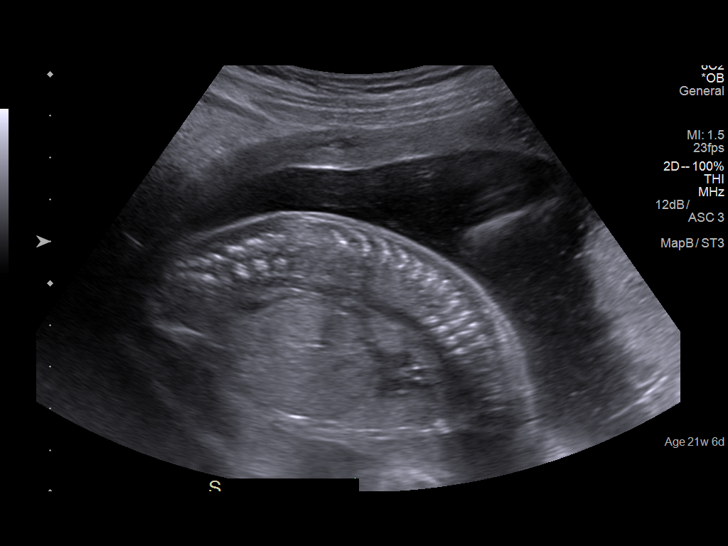
[im 71/71]
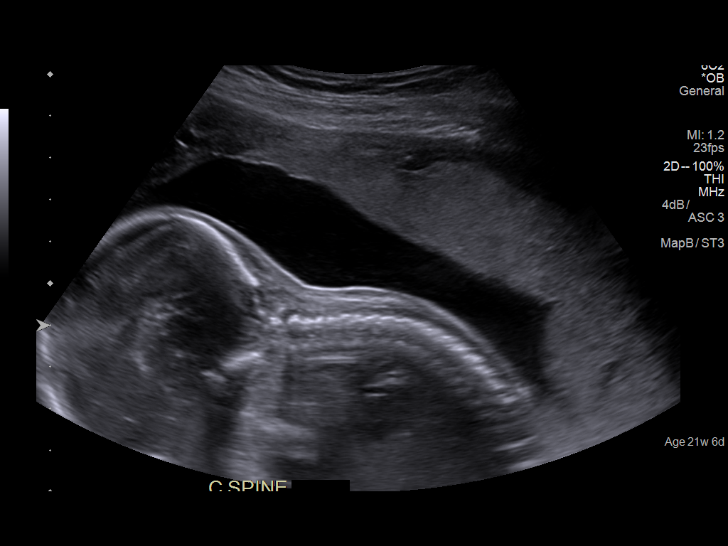

[14 of 28 positions shown; findings below may reference images not displayed]

FINDINGS: Number of Fetuses: 1

Heart Rate:  137 bpm

Movement: Present

Presentation: Cephalic

Previa: None visualized.  Interval resolution.

Placental Location: Anterior

Amniotic Fluid (Subjective): Normal

Amniotic Fluid (Objective):

Vertical pocket 7.1cm

FETAL BIOMETRY

BPD:  5.0cm 21w d

HC:    18.2 cmcm  20w   4d

AC:   16.4 cmcm  21w   3d

FL:   3.2 cmcm  20w   3d

Current Mean GA: 20w 6d              US EDC: 01/30/2018

FETAL ANATOMY

Lateral Ventricles: Visualized

Thalami/CSP: Visualized

Posterior Fossa:  Visualized

Upper Lip: Visualized

Spine: Visualized

4 Chamber Heart on Left: Visualized

LVOT: Visualized

RVOT: Visualized

Stomach on Left: Visualized

3 Vessel Cord: Visualized

Cord Insertion site: Visualized

Kidneys: Visualized

Bladder: Visualized

Extremities: Visualized

Maternal Findings:

Cervix:  Closed.
IMPRESSION: 1. Single viable intrauterine pregnancy in cephalic presentation at
20 weeks 6 days.

2. Interval resolution of placenta previa.

## 2017-11-11 LAB — OB RESULTS CONSOLE HIV ANTIBODY (ROUTINE TESTING): HIV: NONREACTIVE

## 2017-11-12 LAB — OB RESULTS CONSOLE RPR: RPR: NONREACTIVE

## 2017-12-30 NOTE — L&D Delivery Note (Signed)
Delivery Note Primary OB: [facility] Delivery Provider: Marcelyn BruinsJacelyn Schmid, CNM Gestational Age: Full term Antepartum complications: anemia, placenta previa (resolved) Intrapartum complications: Shoulder Dystocia  A viable Female was delivered via vertex presentation. No nuchal cord was present. Attempting gentle downward traction did not deliver the anterior shoulder. A shoulder dystocia was announced. Suprapubic pressure was attempted, followed by McRoberts maneuver.  The baby rotated to OP. Suprapubic pressure was again given, and delivery of the shoulder followed with gentle downward traction. The total length of the shoulder dystocia was one minute. The delivery of the body followed without difficulty. The infant was placed on the maternal abdomen. The umbilical cord was doubly clamped and cut following delayed cord clamping. The placenta was delivered spontaneously and was inspected and found to be intact with a three vessel cord. The cervix and vagina were inspected. There were no lacerations.  The fundus was firm after administration of Pitocin bolus. Patient and infant were bonding in stable condition. All counts were correct.  Apgars:7, 9  Weight:  8 lb 0 oz.   Placenta status: spontaneous and Intact.  Cord: 3 vessels; with the following complications: none.  Anesthesia:  epidural Episiotomy:  none Lacerations:  none Suture Repair: none Est. Blood Loss (mL):  750  Mom to postpartum.  Baby to Couplet care / Skin to Skin.  Marcelyn BruinsJacelyn Schmid, CNM [facility] 01/25/2018  5:47 AM   I was present during the entire delivery of the infant and placenta.  Shoulder dystocia managed by the CNM, as above, without difficulty.  Inspection of the infant after delivery showed no obvious limitations of motion of either arm (left was anterior shoulder). No lacerations and normal lochia at the end of the delivery of the placenta.    Thomasene MohairStephen Daija Routson, MD 01/25/2018 2:55 PM

## 2018-01-07 LAB — OB RESULTS CONSOLE GC/CHLAMYDIA
CHLAMYDIA, DNA PROBE: NEGATIVE
Gonorrhea: NEGATIVE

## 2018-01-07 LAB — OB RESULTS CONSOLE GBS: STREP GROUP B AG: NEGATIVE

## 2018-01-14 ENCOUNTER — Emergency Department
Admission: EM | Admit: 2018-01-14 | Discharge: 2018-01-14 | Disposition: A | Discharge status: Home / Self Care | Payer: Self-pay | Attending: Emergency Medicine | Admitting: Emergency Medicine

## 2018-01-14 ENCOUNTER — Encounter: Payer: Self-pay | Admitting: *Deleted

## 2018-01-14 ENCOUNTER — Other Ambulatory Visit: Payer: Self-pay

## 2018-01-14 DIAGNOSIS — M791 Myalgia, unspecified site: Secondary | ICD-10-CM | POA: Insufficient documentation

## 2018-01-14 DIAGNOSIS — R05 Cough: Secondary | ICD-10-CM | POA: Insufficient documentation

## 2018-01-14 DIAGNOSIS — R509 Fever, unspecified: Secondary | ICD-10-CM | POA: Insufficient documentation

## 2018-01-14 DIAGNOSIS — Z79899 Other long term (current) drug therapy: Secondary | ICD-10-CM | POA: Insufficient documentation

## 2018-01-14 DIAGNOSIS — Z3A38 38 weeks gestation of pregnancy: Secondary | ICD-10-CM | POA: Insufficient documentation

## 2018-01-14 DIAGNOSIS — J111 Influenza due to unidentified influenza virus with other respiratory manifestations: Secondary | ICD-10-CM | POA: Insufficient documentation

## 2018-01-14 DIAGNOSIS — R51 Headache: Secondary | ICD-10-CM | POA: Insufficient documentation

## 2018-01-14 DIAGNOSIS — R0989 Other specified symptoms and signs involving the circulatory and respiratory systems: Secondary | ICD-10-CM | POA: Insufficient documentation

## 2018-01-14 DIAGNOSIS — O9989 Other specified diseases and conditions complicating pregnancy, childbirth and the puerperium: Secondary | ICD-10-CM | POA: Insufficient documentation

## 2018-01-14 LAB — INFLUENZA PANEL BY PCR (TYPE A & B)
INFLAPCR: POSITIVE — AB
Influenza B By PCR: NEGATIVE

## 2018-01-14 MED ORDER — OSELTAMIVIR PHOSPHATE 75 MG PO CAPS
75.0000 mg | ORAL_CAPSULE | Freq: Once | ORAL | Status: AC
  Administered 2018-01-14: 75 mg via ORAL
  Filled 2018-01-14: qty 1

## 2018-01-14 MED ORDER — ACETAMINOPHEN 325 MG PO TABS
650.0000 mg | ORAL_TABLET | Freq: Once | ORAL | Status: AC
  Administered 2018-01-14: 650 mg via ORAL
  Filled 2018-01-14: qty 2

## 2018-01-14 MED ORDER — OSELTAMIVIR PHOSPHATE 75 MG PO CAPS: 75.0000 mg | ORAL_CAPSULE | Freq: Two times a day (BID) | ORAL | 0 refills | Status: AC

## 2018-01-14 NOTE — ED Triage Notes (Signed)
Pt is [redacted] weeks pregnant.  Pt has a cough and fever.sx for 2 days. Pt denies abd pain.  No vag bleeding.

## 2018-01-14 NOTE — ED Provider Notes (Signed)
[facility] Emergency Department Provider Note  ____________________________________________   First MD Initiated Contact with Patient 01/14/18 2021     (approximate)  I have reviewed the triage vital signs and the nursing notes.   HISTORY  Chief Complaint Cough and Fever   HPI Brandi Wallace is a 27 y.o. female who is [redacted] weeks pregnant who is presenting to the emergency department today with 2 days of subjective fever, body aches, runny nose and a dry cough.  She also says that she is a frontal headache with a dry cough.  Is denying any chest pain, abdominal pain, vaginal bleeding or discharge.  Husband says that he was exposed to someone with flu but the patient has not been directly exposed to anyone with flu.  However, her to children do have active URIs with runny nose and cough.   No past medical history on file.  Patient Active Problem List   Diagnosis Date Noted  . Postpartum care following vaginal delivery 03/19/2016  . Vaginal bleeding in pregnancy 03/17/2016  . Normal labor 03/17/2016    Past Surgical History:  Procedure Laterality Date  . DENTAL SURGERY Left     Prior to Admission medications   Medication Sig Start Date End Date Taking? Authorizing Provider  Prenatal Vit-Fe Fumarate-FA (PRENATAL MULTIVITAMIN) TABS tablet Take 1 tablet by mouth daily at 12 noon.    [provider]    Allergies Patient has no known allergies.  No family history on file.  Social History Social History   Tobacco Use  . Smoking status: Never Smoker  . Smokeless tobacco: Never Used  Substance Use Topics  . Alcohol use: No  . Drug use: No    Review of Systems  Constitutional: No fever/chills Eyes: No visual changes. ENT: No sore throat. Cardiovascular: Denies chest pain. Respiratory: As above Gastrointestinal: No abdominal pain.  No nausea, no vomiting.  No diarrhea.  No constipation. Genitourinary: Negative for  dysuria. Musculoskeletal: Negative for back pain. Skin: Negative for rash. Neurological: Negative for headaches, focal weakness or numbness.   ____________________________________________   PHYSICAL EXAM:  VITAL SIGNS: ED Triage Vitals  Enc Vitals Group     BP 01/14/18 1824 120/75     Pulse Rate 01/14/18 1824 97     Resp 01/14/18 2031 20     Temp 01/14/18 1824 99.7 F (37.6 C)     Temp Source 01/14/18 1824 Oral     SpO2 01/14/18 1824 100 %     Weight 01/14/18 1824 138 lb (62.6 kg)     Height 01/14/18 1824 5' (1.524 m)     Head Circumference --      Peak Flow --      Pain Score --      Pain Loc --      Pain Edu? --      Excl. in GC? --     Constitutional: Alert and oriented. Well appearing and in no acute distress. Eyes: Conjunctivae are normal.  Head: Atraumatic. Nose: No congestion/rhinnorhea. Mouth/Throat: Mucous membranes are moist.  Neck: No stridor.   Cardiovascular: Normal rate, regular rhythm. Grossly normal heart sounds.   Respiratory: Normal respiratory effort.  No retractions. Lungs CTAB. Gastrointestinal: Gravid abdomen that is nontender.  Appears to be consistent with dates.  No CVA tenderness. Musculoskeletal: No lower extremity tenderness nor edema.  No joint effusions. Neurologic:  Normal speech and language. No gross focal neurologic deficits are appreciated. Skin:  Skin is warm, dry and intact.  No rash noted. Psychiatric: Mood and affect are normal. Speech and behavior are normal.  ____________________________________________   LABS (all labs ordered are listed, but only abnormal results are displayed)  Labs Reviewed  INFLUENZA PANEL BY PCR (TYPE A & B)   ____________________________________________  EKG   ____________________________________________  RADIOLOGY   ____________________________________________   PROCEDURES  Procedure(s) performed:   Procedures  Critical Care performed:    ____________________________________________   INITIAL IMPRESSION / ASSESSMENT AND PLAN / ED COURSE  Pertinent labs & imaging results that were available during my care of the patient were reviewed by me and considered in my medical decision making (see chart for details).  Differential includes, but is not limited to, viral syndrome, bronchitis including COPD exacerbation, pneumonia, reactive airway disease including asthma, CHF including exacerbation with or without pulmonary/interstitial edema, pneumothorax, ACS, thoracic trauma, and pulmonary embolism. As part of my medical decision making, I reviewed the following data within the electronic MEDICAL RECORD NUMBER Notes from prior ED visits  Patient's lungs are clear to auscultation.  Discussed risks and benefit of chest x-ray with the patient as well as husband and they were agreed not to have a chest x-ray at this time and avoid the radiation exposure to the fetus.  ----------------------------------------- 9:57 PM on 01/14/2018 -----------------------------------------  Patient is flu positive and is within the Tamiflu window.  Also high risk because she is pregnant.  We will treat her with Tamiflu.  Patient to return for any worsening or concerning symptoms.  Do not suspect superimposed pneumonia at this time.  Patient and family updated as to diagnosis and treatment plan.  Patient will be discharged at this time.  Fetal heart tones intact to the 160s.      ____________________________________________   FINAL CLINICAL IMPRESSION(S) / ED DIAGNOSES  Influenza.    NEW MEDICATIONS STARTED DURING THIS VISIT:  New Prescriptions   No medications on file     Note:  This document was prepared using Dragon voice recognition software and may include unintentional dictation errors.     Myrna Blazer, MD 01/14/18 2157

## 2018-01-14 NOTE — ED Notes (Signed)
MD at the bedside for pt evaluation  

## 2018-01-14 NOTE — ED Notes (Signed)
No labs at this time per dr Sharma Covertnorman

## 2018-01-25 ENCOUNTER — Other Ambulatory Visit: Payer: Self-pay

## 2018-01-25 ENCOUNTER — Inpatient Hospital Stay: Payer: Medicaid Other | Admitting: Anesthesiology

## 2018-01-25 ENCOUNTER — Inpatient Hospital Stay
Admission: EM | Admit: 2018-01-25 | Discharge: 2018-01-26 | DRG: 807 | Disposition: A | Discharge status: Home / Self Care | Payer: Medicaid Other | Attending: Obstetrics and Gynecology | Admitting: Obstetrics and Gynecology

## 2018-01-25 DIAGNOSIS — D649 Anemia, unspecified: Secondary | ICD-10-CM | POA: Diagnosis present

## 2018-01-25 DIAGNOSIS — Z3A39 39 weeks gestation of pregnancy: Secondary | ICD-10-CM

## 2018-01-25 DIAGNOSIS — O9902 Anemia complicating childbirth: Secondary | ICD-10-CM | POA: Diagnosis present

## 2018-01-25 DIAGNOSIS — Z3483 Encounter for supervision of other normal pregnancy, third trimester: Secondary | ICD-10-CM | POA: Diagnosis present

## 2018-01-25 LAB — CBC
HEMATOCRIT: 35.3 % (ref 35.0–47.0)
Hemoglobin: 11.7 g/dL — ABNORMAL LOW (ref 12.0–16.0)
MCH: 28 pg (ref 26.0–34.0)
MCHC: 33.2 g/dL (ref 32.0–36.0)
MCV: 84.4 fL (ref 80.0–100.0)
Platelets: 252 10*3/uL (ref 150–440)
RBC: 4.18 MIL/uL (ref 3.80–5.20)
RDW: 19.7 % — AB (ref 11.5–14.5)
WBC: 6.1 10*3/uL (ref 3.6–11.0)

## 2018-01-25 LAB — TYPE AND SCREEN
ABO/RH(D): O POS
ANTIBODY SCREEN: NEGATIVE

## 2018-01-25 MED ORDER — DIPHENHYDRAMINE HCL 50 MG/ML IJ SOLN: 12.5000 mg | INTRAMUSCULAR | Status: DC | PRN

## 2018-01-25 MED ORDER — DIPHENHYDRAMINE HCL 25 MG PO CAPS: 25.0000 mg | ORAL_CAPSULE | Freq: Four times a day (QID) | ORAL | Status: DC | PRN

## 2018-01-25 MED ORDER — LACTATED RINGERS IV SOLN: 500.0000 mL | INTRAVENOUS | Status: DC | PRN

## 2018-01-25 MED ORDER — SIMETHICONE 80 MG PO CHEW: 80.0000 mg | CHEWABLE_TABLET | ORAL | Status: DC | PRN

## 2018-01-25 MED ORDER — LACTATED RINGERS IV SOLN
INTRAVENOUS | Status: DC
  Administered 2018-01-25: 02:00:00 via INTRAVENOUS

## 2018-01-25 MED ORDER — ONDANSETRON HCL 4 MG/2ML IJ SOLN: 4.0000 mg | INTRAMUSCULAR | Status: DC | PRN

## 2018-01-25 MED ORDER — LIDOCAINE HCL (PF) 1 % IJ SOLN
INTRAMUSCULAR | Status: DC | PRN
  Administered 2018-01-25: 3 mL

## 2018-01-25 MED ORDER — ACETAMINOPHEN 325 MG PO TABS: 650.0000 mg | ORAL_TABLET | ORAL | Status: DC | PRN

## 2018-01-25 MED ORDER — EPHEDRINE 5 MG/ML INJ: 10.0000 mg | INTRAVENOUS | Status: DC | PRN

## 2018-01-25 MED ORDER — FERROUS SULFATE 325 (65 FE) MG PO TABS
325.0000 mg | ORAL_TABLET | Freq: Two times a day (BID) | ORAL | Status: DC
  Administered 2018-01-25 – 2018-01-26 (×3): 325 mg via ORAL
  Filled 2018-01-25 (×2): qty 1

## 2018-01-25 MED ORDER — SENNOSIDES-DOCUSATE SODIUM 8.6-50 MG PO TABS
2.0000 | ORAL_TABLET | ORAL | Status: DC
  Administered 2018-01-26: 2 via ORAL

## 2018-01-25 MED ORDER — BENZOCAINE-MENTHOL 20-0.5 % EX AERO: 1.0000 "application " | INHALATION_SPRAY | CUTANEOUS | Status: DC | PRN

## 2018-01-25 MED ORDER — PHENYLEPHRINE 40 MCG/ML (10ML) SYRINGE FOR IV PUSH (FOR BLOOD PRESSURE SUPPORT): 80.0000 ug | PREFILLED_SYRINGE | INTRAVENOUS | Status: DC | PRN

## 2018-01-25 MED ORDER — FENTANYL CITRATE (PF) 100 MCG/2ML IJ SOLN: 50.0000 ug | INTRAMUSCULAR | Status: DC | PRN

## 2018-01-25 MED ORDER — COCONUT OIL OIL
1.0000 "application " | TOPICAL_OIL | Status: DC | PRN
  Administered 2018-01-26: 1 via TOPICAL
  Filled 2018-01-25: qty 120

## 2018-01-25 MED ORDER — FENTANYL 2.5 MCG/ML W/ROPIVACAINE 0.15% IN NS 100 ML EPIDURAL (ARMC)
EPIDURAL | Status: AC
  Filled 2018-01-25: qty 100

## 2018-01-25 MED ORDER — MISOPROSTOL 200 MCG PO TABS
ORAL_TABLET | ORAL | Status: AC
  Filled 2018-01-25: qty 4

## 2018-01-25 MED ORDER — IBUPROFEN 600 MG PO TABS
600.0000 mg | ORAL_TABLET | Freq: Four times a day (QID) | ORAL | Status: DC
  Administered 2018-01-25 – 2018-01-26 (×4): 600 mg via ORAL
  Filled 2018-01-25 (×3): qty 1

## 2018-01-25 MED ORDER — PRENATAL MULTIVITAMIN CH
1.0000 | ORAL_TABLET | Freq: Every day | ORAL | Status: DC
  Administered 2018-01-25 – 2018-01-26 (×2): 1 via ORAL
  Filled 2018-01-25 (×2): qty 1

## 2018-01-25 MED ORDER — LACTATED RINGERS IV SOLN
500.0000 mL | Freq: Once | INTRAVENOUS | Status: AC
  Administered 2018-01-25: 500 mL via INTRAVENOUS

## 2018-01-25 MED ORDER — OXYTOCIN BOLUS FROM INFUSION
500.0000 mL | Freq: Once | INTRAVENOUS | Status: AC
  Administered 2018-01-25: 500 mL via INTRAVENOUS

## 2018-01-25 MED ORDER — DIBUCAINE 1 % RE OINT: 1.0000 "application " | TOPICAL_OINTMENT | RECTAL | Status: DC | PRN

## 2018-01-25 MED ORDER — FENTANYL 2.5 MCG/ML W/ROPIVACAINE 0.15% IN NS 100 ML EPIDURAL (ARMC)
12.0000 mL/h | EPIDURAL | Status: DC
  Administered 2018-01-25: 12 mL/h via EPIDURAL

## 2018-01-25 MED ORDER — ONDANSETRON HCL 4 MG/2ML IJ SOLN
4.0000 mg | Freq: Four times a day (QID) | INTRAMUSCULAR | Status: DC | PRN
  Administered 2018-01-25: 4 mg via INTRAVENOUS
  Filled 2018-01-25: qty 2

## 2018-01-25 MED ORDER — AMMONIA AROMATIC IN INHA
RESPIRATORY_TRACT | Status: AC
  Filled 2018-01-25: qty 10

## 2018-01-25 MED ORDER — SODIUM CHLORIDE 0.9 % IV SOLN
INTRAVENOUS | Status: DC | PRN
  Administered 2018-01-25 (×2): 5 mL via EPIDURAL

## 2018-01-25 MED ORDER — OXYTOCIN 40 UNITS IN LACTATED RINGERS INFUSION - SIMPLE MED
2.5000 [IU]/h | INTRAVENOUS | Status: DC
  Administered 2018-01-25: 2.5 [IU]/h via INTRAVENOUS

## 2018-01-25 MED ORDER — ONDANSETRON HCL 4 MG PO TABS: 4.0000 mg | ORAL_TABLET | ORAL | Status: DC | PRN

## 2018-01-25 MED ORDER — OXYTOCIN 10 UNIT/ML IJ SOLN
INTRAMUSCULAR | Status: AC
  Filled 2018-01-25: qty 2

## 2018-01-25 MED ORDER — WITCH HAZEL-GLYCERIN EX PADS: 1.0000 "application " | MEDICATED_PAD | CUTANEOUS | Status: DC | PRN

## 2018-01-25 MED ORDER — LIDOCAINE HCL (PF) 1 % IJ SOLN
INTRAMUSCULAR | Status: AC
  Filled 2018-01-25: qty 30

## 2018-01-25 MED ORDER — OXYTOCIN 40 UNITS IN LACTATED RINGERS INFUSION - SIMPLE MED
INTRAVENOUS | Status: AC
  Filled 2018-01-25: qty 1000

## 2018-01-25 MED ORDER — HYDROCODONE-ACETAMINOPHEN 5-325 MG PO TABS: 1.0000 | ORAL_TABLET | Freq: Four times a day (QID) | ORAL | Status: DC | PRN

## 2018-01-25 NOTE — H&P (Signed)
Obstetrics Admission History & Physical   Contractions   HPI:  27 y.o. [id number] @ 4962w3d (01/29/2018, Date entered prior to episode creation). Admitted on 01/25/2018:   Patient Active Problem List   Diagnosis Date Noted  . Normal labor and delivery 01/25/2018     Presents for contractions that began on 1/26 at 11 pm. They are about every 2-3 minutes apart and she rates her pain as 8/10. She denies vaginal bleeding, loss of fluid, headache. She endorses good fetal movement.  Prenatal care at: at [facility]. Pregnancy complicated by anemia and resolved placenta previa.  ROS: A review of systems was performed and negative, except as stated in the above HPI. PMHx: No past medical history on file. PSHx:  Past Surgical History:  Procedure Laterality Date  . DENTAL SURGERY Left    Medications:  Medications Prior to Admission  Medication Sig Dispense Refill Last Dose  . Prenatal Vit-Fe Fumarate-FA (PRENATAL MULTIVITAMIN) TABS tablet Take 1 tablet by mouth daily at 12 noon.   01/24/2018 at Unknown time   Allergies: has No Known Allergies. OBHx:  OB History  Gravida Para Term Preterm AB Living  3 2 2  0 0 2  SAB TAB Ectopic Multiple Live Births  0 0 0 0 2    # Outcome Date GA Lbr Len/2nd Weight Sex Delivery Anes PTL Lv  3 Current           2 Term 03/17/16 5367w1d 00:26 / 00:20 7 lb 10.8 oz (3.48 kg) M Vag-Spont Local, EPI  LIV  1 Term 11/20/15 4134w0d  9 lb 5 oz (4.224 kg) M Vag-Vacuum None N LIV    Obstetric Comments  Pt had "large epis. Due to baby's size"   JYN:WGNFAOZH/YQMVHQIONGEXFHx:Negative/unremarkable except as detailed in HPI.Marland Kitchen.  No family history of birth defects. Soc Hx: Never smoker, Alcohol: none, Recreational drug use: none, Denies domestic abuse and Pregnancy welcomed.  Objective:   Vitals:   01/25/18 0239 01/25/18 0246  BP: 110/70 115/77  Pulse: 72 72  Resp:    Temp:    SpO2:  100%   Constitutional: Well nourished, well developed female in no acute distress.  HEENT: normal Skin: Warm and dry.   Cardiovascular:Regular rate and rhythm.   Extremity: trace to 1+ bilateral pedal edema Respiratory: Clear to auscultation bilaterally. Normal respiratory effort Abdomen: moderate Back: no CVAT Neuro: DTRs 2+, Cranial nerves grossly intact Psych: Alert and Oriented x3. No memory deficits. Normal mood and affect.  MS: normal gait, normal bilateral lower extremity ROM/strength/stability.  Pelvic exam: is not limited by body habitus External Genitalia, Bartholin's glands, Urethra, Skene's glands: within normal limits Vagina: within normal limits and with normal mucosa, no blood in the vault Cervix: 7/80/0 Uterus: Spontaneous uterine activity and Adequate relaxation between contractions.  Adnexa: not evaluated  EFM:FHR: 135 bpm, variability: moderate,  accelerations:  Present,  decelerations:  Absent Toco: Frequency: Every 1-3 minutes, Duration: 50-100 seconds and Intensity: moderate   Perinatal info:  Blood type: O positive Rubella - Immune Varicella - Immune TDaP Given during third trimester of this pregnancy RPR NR / HIV Neg/ HBsAg Neg   Assessment & Plan:   27 y.o. [id number] @ 6662w3d, Admitted on 01/25/2018: for contractions/ labor.    Admit for labor, Observe for cervical change, Fetal Wellbeing Reassuring, Epidural when ready, AROM when Appropriate and GBS status negative, treat as needed  Marcelyn BruinsJacelyn Kaliah Haddaway, CNM [facility], [facility] 01/25/2018  2:50 AM

## 2018-01-25 NOTE — Anesthesia Preprocedure Evaluation (Signed)
Anesthesia Evaluation  Patient identified by MRN, date of birth, ID band Patient awake    Reviewed: Allergy & Precautions, NPO status , Patient's Chart, lab work & pertinent test results  History of Anesthesia Complications Negative for: history of anesthetic complications  Airway Mallampati: II  TM Distance: >3 FB Neck ROM: Full    Dental no notable dental hx.    Pulmonary neg pulmonary ROS, neg sleep apnea, neg COPD,    breath sounds clear to auscultation- rhonchi (-) wheezing      Cardiovascular Exercise Tolerance: Good (-) hypertension(-) CAD and (-) Past MI  Rhythm:Regular Rate:Normal - Systolic murmurs and - Diastolic murmurs    Neuro/Psych negative neurological ROS  negative psych ROS   GI/Hepatic negative GI ROS, Neg liver ROS,   Endo/Other  negative endocrine ROSneg diabetes  Renal/GU negative Renal ROS     Musculoskeletal negative musculoskeletal ROS (+)   Abdominal Gravid abdomen  Peds  Hematology negative hematology ROS (+)   Anesthesia Other Findings   Reproductive/Obstetrics (+) Pregnancy                             Lab Results  Component Value Date   WBC 6.1 01/25/2018   HGB 11.7 (L) 01/25/2018   HCT 35.3 01/25/2018   MCV 84.4 01/25/2018   PLT 252 01/25/2018    Anesthesia Physical Anesthesia Plan  ASA: II  Anesthesia Plan: Epidural   Post-op Pain Management:    Induction:   PONV Risk Score and Plan: 2  Airway Management Planned:   Additional Equipment:   Intra-op Plan:   Post-operative Plan:   Informed Consent: I have reviewed the patients History and Physical, chart, labs and discussed the procedure including the risks, benefits and alternatives for the proposed anesthesia with the patient or authorized representative who has indicated his/her understanding and acceptance.     Plan Discussed with: CRNA and Anesthesiologist  Anesthesia Plan  Comments: (Plan for epidural for labor, discussed epidural vs spinal vs GA if need for csection)        Anesthesia Quick Evaluation

## 2018-01-25 NOTE — Discharge Summary (Signed)
OB Discharge Summary     Patient Name: Brandi Wallace DOB: [birth year] MRN: [id number]  Date of admission: 01/25/2018 Delivering CNM: Brandi Wallace, CNM  Date of Delivery: 01/25/2018  Date of discharge: 01/26/2018  Admitting diagnosis: Term pregnancy, contractions Intrauterine pregnancy: 6831w3d     Secondary diagnosis: None     Discharge diagnosis: Term Pregnancy Delivered                       Hospital course:  Onset of Labor With Vaginal Delivery     27 y.o. yo G3P2002 at 6331w3d was admitted in Active Labor on 01/25/2018. Patient had an uncomplicated labor course. Membrane Rupture Time/Date: 4:30 AM ,01/25/2018   Patient had a delivery of a Viable infant. 01/25/2018  Information for the patient's newborn:  Brandi Wallace, Girl [name] [[id number]][[id number]]  Delivery Method: Vag-Spont There was a one minute shoulder dystocia resolved with suprapubic pressure.  Patient had an uncomplicated postpartum course.  She is ambulating, tolerating a regular diet, passing flatus, and urinating well. Patient is discharged home in stable condition on 01/26/18.                                                                  Post partum procedures:none  Complications: None  Physical exam on 01/26/2018: Vitals:   01/25/18 1933 01/25/18 2030 01/26/18 0005 01/26/18 0756  BP: (!) 91/45 (!) 96/52 (!) 107/49 (!) 97/53  Pulse: 67  68 70  Resp: 20  18 18   Temp: 98.1 F (36.7 C)  98.4 F (36.9 C) 97.7 F (36.5 C)  TempSrc: Oral  Oral Oral  SpO2: 100%  100% 99%  Weight:      Height:       General: alert, cooperative and no distress Lochia: appropriate Uterine Fundus: firm Incision: N/A DVT Evaluation: No evidence of DVT seen on physical exam.  Labs: Lab Results  Component Value Date   WBC 7.8 01/26/2018   HGB 9.2 (L) 01/26/2018   HCT 27.4 (L) 01/26/2018   MCV 84.6 01/26/2018   PLT 198 01/26/2018   No flowsheet data found.  Discharge instructions: per After Visit Summary.  Medications:   Allergies as of 01/26/2018   No Known Allergies     Medication List    TAKE these medications   prenatal multivitamin Tabs tablet Take 1 tablet by mouth daily at 12 noon.       Diet: routine diet  Activity: Advance as tolerated. Pelvic rest for 6 weeks.   Outpatient follow up: Follow-up Information    Department, [facility]. Schedule an appointment as soon as possible for a visit in 6 week(s).   Why:  postpartum follow up appointment  Contact information: [address] [place] [phone]             Postpartum contraception: Nexplanon Rhogam Given postpartum: Not indicated Rubella vaccine given postpartum: no Varicella vaccine given postpartum: no TDaP given antepartum or postpartum: No  Newborn Data: Live born female  Birth Weight: 8 lb 0 oz (3630 g) APGAR: 7, 9  Newborn Delivery   Time head delivered:  01/25/2018 05:01:00 Birth date/time:  01/25/2018 05:02:00 Delivery type:  Vaginal, Spontaneous      Baby Feeding:  Bottle and Breast  Disposition:home with mother  SIGNED: Tresea Mall, CNM

## 2018-01-25 NOTE — Anesthesia Procedure Notes (Signed)
Epidural Patient location during procedure: OB Start time: 01/25/2018 2:37 AM End time: 01/25/2018 2:52 AM  Staffing Anesthesiologist: Alver FisherPenwarden, Katherina Wimer, MD Performed: anesthesiologist   Preanesthetic Checklist Completed: patient identified, site marked, surgical consent, pre-op evaluation, timeout performed, IV checked, risks and benefits discussed and monitors and equipment checked  Epidural Patient position: sitting Prep: ChloraPrep Patient monitoring: heart rate, continuous pulse ox and blood pressure Approach: midline Location: L3-L4 Injection technique: LOR saline  Needle:  Needle type: Tuohy  Needle gauge: 18 G Needle length: 9 cm and 9 Needle insertion depth: 4 cm Catheter type: closed end flexible Catheter size: 20 Guage Catheter at skin depth: 8 cm Test dose: negative (0.125% bupivacaine)  Assessment Events: blood not aspirated, injection not painful, no injection resistance, negative IV test and no paresthesia  Additional Notes   Patient tolerated the insertion well without complications.Reason for block:procedure for pain

## 2018-01-26 DIAGNOSIS — D649 Anemia, unspecified: Secondary | ICD-10-CM

## 2018-01-26 DIAGNOSIS — Z3A39 39 weeks gestation of pregnancy: Secondary | ICD-10-CM

## 2018-01-26 DIAGNOSIS — O9902 Anemia complicating childbirth: Secondary | ICD-10-CM

## 2018-01-26 LAB — CBC
HCT: 27.4 % — ABNORMAL LOW (ref 35.0–47.0)
Hemoglobin: 9.2 g/dL — ABNORMAL LOW (ref 12.0–16.0)
MCH: 28.3 pg (ref 26.0–34.0)
MCHC: 33.4 g/dL (ref 32.0–36.0)
MCV: 84.6 fL (ref 80.0–100.0)
PLATELETS: 198 10*3/uL (ref 150–440)
RBC: 3.24 MIL/uL — ABNORMAL LOW (ref 3.80–5.20)
RDW: 19.9 % — AB (ref 11.5–14.5)
WBC: 7.8 10*3/uL (ref 3.6–11.0)

## 2018-01-26 LAB — SYPHILIS: RPR W/REFLEX TO RPR TITER AND TREPONEMAL ANTIBODIES, TRADITIONAL SCREENING AND DIAGNOSIS ALGORITHM: RPR Ser Ql: NONREACTIVE

## 2018-01-26 NOTE — Anesthesia Postprocedure Evaluation (Signed)
Anesthesia Post Note  Patient: Brandi Wallace  Procedure(s) Performed: AN AD HOC LABOR EPIDURAL  Patient location during evaluation: Mother Baby Anesthesia Type: Epidural Level of consciousness: awake, awake and alert and oriented Pain management: pain level controlled Vital Signs Assessment: post-procedure vital signs reviewed and stable Respiratory status: spontaneous breathing Cardiovascular status: blood pressure returned to baseline Postop Assessment: no headache, no backache, no apparent nausea or vomiting and adequate PO intake Anesthetic complications: no     Last Vitals:  Vitals:   01/25/18 2030 01/26/18 0005  BP: (!) 96/52 (!) 107/49  Pulse:  68  Resp:  18  Temp:  36.9 C  SpO2:  100%    Last Pain:  Vitals:   01/26/18 0549  TempSrc:   PainSc: 0-No pain                 Karlos Scadden Lawerance CruelStarr

## 2018-01-26 NOTE — Progress Notes (Signed)
Discharge inst reviewed with pt.  Verb u/o of f/u care

## 2018-01-26 NOTE — Plan of Care (Signed)
Patient's vital signs stable; fundus firm; small rubra lochia; voiding; breast and bottle feeding infant with good technique observed; good maternal-infant bonding observed; pain controlled with po motrin; husband at bedside and attentive; good po fluids; good appetite.

## 2018-01-26 NOTE — Progress Notes (Signed)
Patient is doing well today on day 1 s/p spontaneous vaginal delivery. She is progressing well and will discharge to home today pending newborn screenings.   Brandi Wallace, CNM

## 2018-01-26 NOTE — Progress Notes (Signed)
Discharge home to car via auxillary
